# Patient Record
Sex: Male | Born: 1955
Health system: Southern US, Community
[De-identification: ages and names within clinical notes are randomized; demographics above are authoritative.]

## PROBLEM LIST (undated history)

## (undated) DIAGNOSIS — C801 Malignant (primary) neoplasm, unspecified: Secondary | ICD-10-CM

## (undated) DIAGNOSIS — R011 Cardiac murmur, unspecified: Secondary | ICD-10-CM

## (undated) HISTORY — PX: COLONOSCOPY: SHX174

---

## 2001-06-11 ENCOUNTER — Encounter: Payer: Self-pay | Admitting: Internal Medicine

## 2001-06-11 ENCOUNTER — Encounter: Admission: RE | Admit: 2001-06-11 | Discharge: 2001-06-11 | Payer: Self-pay | Admitting: Internal Medicine

## 2006-12-16 ENCOUNTER — Ambulatory Visit (HOSPITAL_COMMUNITY): Admission: RE | Admit: 2006-12-16 | Discharge: 2006-12-16 | Payer: Self-pay | Admitting: Internal Medicine

## 2010-09-26 ENCOUNTER — Encounter: Payer: Self-pay | Admitting: Internal Medicine

## 2016-08-25 DIAGNOSIS — M545 Low back pain: Secondary | ICD-10-CM | POA: Diagnosis not present

## 2016-08-25 DIAGNOSIS — M25551 Pain in right hip: Secondary | ICD-10-CM | POA: Diagnosis not present

## 2017-05-01 DIAGNOSIS — D2272 Melanocytic nevi of left lower limb, including hip: Secondary | ICD-10-CM | POA: Diagnosis not present

## 2017-05-01 DIAGNOSIS — D225 Melanocytic nevi of trunk: Secondary | ICD-10-CM | POA: Diagnosis not present

## 2017-05-01 DIAGNOSIS — D2271 Melanocytic nevi of right lower limb, including hip: Secondary | ICD-10-CM | POA: Diagnosis not present

## 2017-05-01 DIAGNOSIS — Z85828 Personal history of other malignant neoplasm of skin: Secondary | ICD-10-CM | POA: Diagnosis not present

## 2017-05-01 DIAGNOSIS — L570 Actinic keratosis: Secondary | ICD-10-CM | POA: Diagnosis not present

## 2018-05-02 DIAGNOSIS — R05 Cough: Secondary | ICD-10-CM | POA: Diagnosis not present

## 2018-05-21 DIAGNOSIS — Z85828 Personal history of other malignant neoplasm of skin: Secondary | ICD-10-CM | POA: Diagnosis not present

## 2018-05-21 DIAGNOSIS — D225 Melanocytic nevi of trunk: Secondary | ICD-10-CM | POA: Diagnosis not present

## 2018-05-21 DIAGNOSIS — L570 Actinic keratosis: Secondary | ICD-10-CM | POA: Diagnosis not present

## 2018-05-21 DIAGNOSIS — L821 Other seborrheic keratosis: Secondary | ICD-10-CM | POA: Diagnosis not present

## 2018-05-21 DIAGNOSIS — L812 Freckles: Secondary | ICD-10-CM | POA: Diagnosis not present

## 2019-03-01 DIAGNOSIS — Z8249 Family history of ischemic heart disease and other diseases of the circulatory system: Secondary | ICD-10-CM | POA: Diagnosis not present

## 2019-03-01 DIAGNOSIS — Z87891 Personal history of nicotine dependence: Secondary | ICD-10-CM | POA: Diagnosis not present

## 2019-03-01 DIAGNOSIS — R972 Elevated prostate specific antigen [PSA]: Secondary | ICD-10-CM | POA: Diagnosis not present

## 2019-03-01 DIAGNOSIS — Z6832 Body mass index (BMI) 32.0-32.9, adult: Secondary | ICD-10-CM | POA: Diagnosis not present

## 2019-03-01 DIAGNOSIS — E7800 Pure hypercholesterolemia, unspecified: Secondary | ICD-10-CM | POA: Diagnosis not present

## 2019-03-01 DIAGNOSIS — Z125 Encounter for screening for malignant neoplasm of prostate: Secondary | ICD-10-CM | POA: Diagnosis not present

## 2019-03-01 DIAGNOSIS — R739 Hyperglycemia, unspecified: Secondary | ICD-10-CM | POA: Diagnosis not present

## 2019-03-01 DIAGNOSIS — Z23 Encounter for immunization: Secondary | ICD-10-CM | POA: Diagnosis not present

## 2019-03-01 DIAGNOSIS — N529 Male erectile dysfunction, unspecified: Secondary | ICD-10-CM | POA: Diagnosis not present

## 2019-03-01 DIAGNOSIS — Z0000 Encounter for general adult medical examination without abnormal findings: Secondary | ICD-10-CM | POA: Diagnosis not present

## 2019-03-13 ENCOUNTER — Other Ambulatory Visit: Payer: Self-pay | Admitting: Internal Medicine

## 2019-03-13 DIAGNOSIS — E7800 Pure hypercholesterolemia, unspecified: Secondary | ICD-10-CM

## 2019-03-26 ENCOUNTER — Ambulatory Visit
Admission: RE | Admit: 2019-03-26 | Discharge: 2019-03-26 | Disposition: A | Discharge status: Home / Self Care | Payer: No Typology Code available for payment source | Source: Ambulatory Visit | Attending: Internal Medicine | Admitting: Internal Medicine

## 2019-03-26 DIAGNOSIS — E7800 Pure hypercholesterolemia, unspecified: Secondary | ICD-10-CM

## 2019-04-22 DIAGNOSIS — N400 Enlarged prostate without lower urinary tract symptoms: Secondary | ICD-10-CM | POA: Diagnosis not present

## 2019-04-22 DIAGNOSIS — K4090 Unilateral inguinal hernia, without obstruction or gangrene, not specified as recurrent: Secondary | ICD-10-CM | POA: Diagnosis not present

## 2019-04-22 DIAGNOSIS — R972 Elevated prostate specific antigen [PSA]: Secondary | ICD-10-CM | POA: Diagnosis not present

## 2019-06-04 DIAGNOSIS — D225 Melanocytic nevi of trunk: Secondary | ICD-10-CM | POA: Diagnosis not present

## 2019-06-04 DIAGNOSIS — Z85828 Personal history of other malignant neoplasm of skin: Secondary | ICD-10-CM | POA: Diagnosis not present

## 2019-06-04 DIAGNOSIS — D2272 Melanocytic nevi of left lower limb, including hip: Secondary | ICD-10-CM | POA: Diagnosis not present

## 2019-06-04 DIAGNOSIS — D2271 Melanocytic nevi of right lower limb, including hip: Secondary | ICD-10-CM | POA: Diagnosis not present

## 2019-07-15 DIAGNOSIS — R972 Elevated prostate specific antigen [PSA]: Secondary | ICD-10-CM | POA: Diagnosis not present

## 2019-07-15 DIAGNOSIS — C61 Malignant neoplasm of prostate: Secondary | ICD-10-CM | POA: Diagnosis not present

## 2019-08-12 DIAGNOSIS — C61 Malignant neoplasm of prostate: Secondary | ICD-10-CM | POA: Diagnosis not present

## 2019-09-03 DIAGNOSIS — R972 Elevated prostate specific antigen [PSA]: Secondary | ICD-10-CM | POA: Diagnosis not present

## 2019-09-09 DIAGNOSIS — E7800 Pure hypercholesterolemia, unspecified: Secondary | ICD-10-CM | POA: Diagnosis not present

## 2019-09-12 DIAGNOSIS — C61 Malignant neoplasm of prostate: Secondary | ICD-10-CM | POA: Diagnosis not present

## 2019-11-11 DIAGNOSIS — Z23 Encounter for immunization: Secondary | ICD-10-CM | POA: Diagnosis not present

## 2019-12-10 DIAGNOSIS — Z23 Encounter for immunization: Secondary | ICD-10-CM | POA: Diagnosis not present

## 2019-12-18 DIAGNOSIS — C61 Malignant neoplasm of prostate: Secondary | ICD-10-CM | POA: Diagnosis not present

## 2019-12-25 DIAGNOSIS — C61 Malignant neoplasm of prostate: Secondary | ICD-10-CM | POA: Diagnosis not present

## 2020-04-07 DIAGNOSIS — E7800 Pure hypercholesterolemia, unspecified: Secondary | ICD-10-CM | POA: Diagnosis not present

## 2020-04-07 DIAGNOSIS — Z1159 Encounter for screening for other viral diseases: Secondary | ICD-10-CM | POA: Diagnosis not present

## 2020-04-07 DIAGNOSIS — Z0000 Encounter for general adult medical examination without abnormal findings: Secondary | ICD-10-CM | POA: Diagnosis not present

## 2020-04-07 DIAGNOSIS — R7301 Impaired fasting glucose: Secondary | ICD-10-CM | POA: Diagnosis not present

## 2020-04-20 DIAGNOSIS — C61 Malignant neoplasm of prostate: Secondary | ICD-10-CM | POA: Diagnosis not present

## 2020-04-28 ENCOUNTER — Other Ambulatory Visit: Payer: Self-pay | Admitting: Urology

## 2020-04-28 DIAGNOSIS — C61 Malignant neoplasm of prostate: Secondary | ICD-10-CM

## 2020-05-25 ENCOUNTER — Other Ambulatory Visit: Payer: Self-pay

## 2020-05-25 ENCOUNTER — Ambulatory Visit
Admission: RE | Admit: 2020-05-25 | Discharge: 2020-05-25 | Disposition: A | Discharge status: Home / Self Care | Payer: BC Managed Care – PPO | Source: Ambulatory Visit | Attending: Urology | Admitting: Urology

## 2020-05-25 DIAGNOSIS — C61 Malignant neoplasm of prostate: Secondary | ICD-10-CM | POA: Diagnosis not present

## 2020-05-25 IMAGING — MR MR PROSTATE WO/W CM
12 series · 48 of 48 positions shown · IV contrast (multihance)
Comparison: None.

CLINICAL DATA: Prostate carcinoma, Gleason score 3+4=7.

EXAM:
MR PROSTATE WITHOUT AND WITH CONTRAST
TECHNIQUE: Multiplanar multisequence MRI images were obtained of the pelvis
centered about the prostate. Pre and post contrast images were
obtained.
CONTRAST:  19mL MULTIHANCE GADOBENATE DIMEGLUMINE 529 MG/ML IV SOLN

[Series 4: T2 · coronal · 3.0mm · 0.56mm/px · 1 of 26 slices shown (1 of 3)]
[im 1/26]
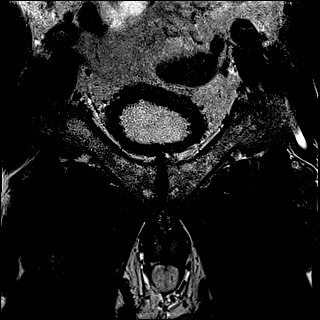

[Series 5: T1 · axial · 5.0mm · 1.25mm/px · z∈[-119,+116]mm · 2 of 96 slices shown]
[im 1/96]
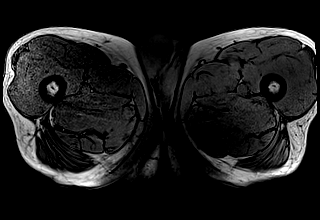
[im 96/96]
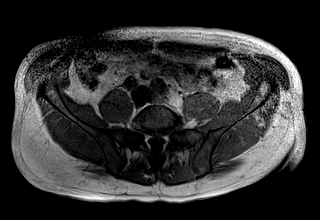

[Series 6: DWI · axial · 3.0mm · 1.75mm/px · 1 of 75 slices shown (1 of 3)]
[im 1/75]
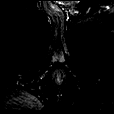

[Series 7: DWI · axial · 3.0mm · 1.75mm/px · 1 of 25 slices shown (2 of 3)]
[im 1/25]
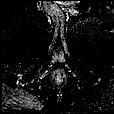

[Series 8: DWI · axial · 3.0mm · 1.75mm/px · 1 of 25 slices shown (3 of 3)]
[im 1/25]
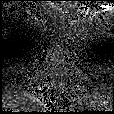

[Series 9: T2 · axial · 3.0mm · 0.56mm/px · 1 of 26 slices shown (2 of 3)]
[im 1/26]
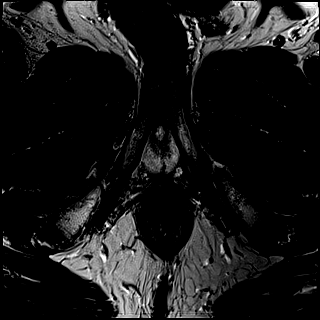

[Series 10: T2 · axial · 1.0mm · 1.04mm/px · z∈[-67,+12]mm · 2 of 80 slices shown (3 of 3)]
[im 1/80]
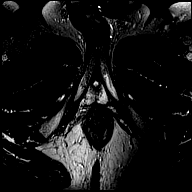
[im 80/80]
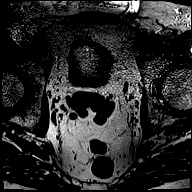

[Series 11: pre t1_twist_tra_dyn · axial · non-contrast · 3.5mm · 0.83mm/px · 1 of 22 slices shown]
[im 1/22]
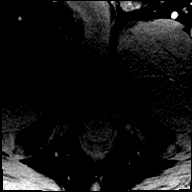

[Series 12: post t1_twist_tra_dyn-copy center · axial · non-contrast · 3.5mm · 0.83mm/px · z∈[-65,+9]mm · 17 of 660 slices shown]
[im 1/660]
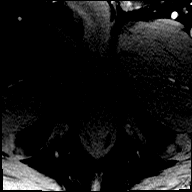
[im 42/660]
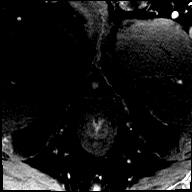
[im 83/660]
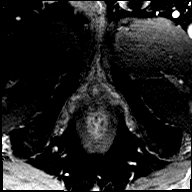
[im 124/660]
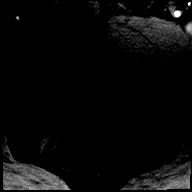
[im 165/660]
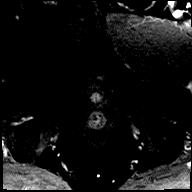
[im 206/660]
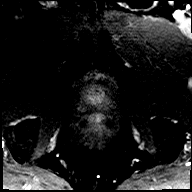
[im 248/660]
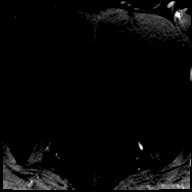
[im 289/660]
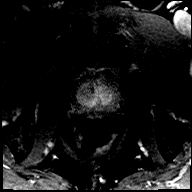
[im 330/660]
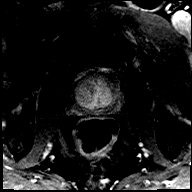
[im 371/660]
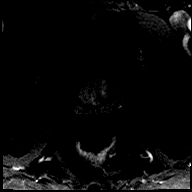
[im 412/660]
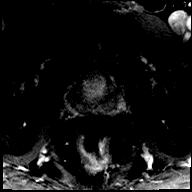
[im 454/660]
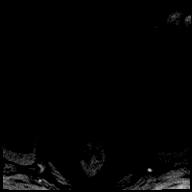
[im 495/660]
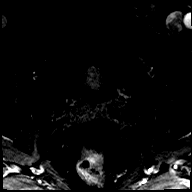
[im 536/660]
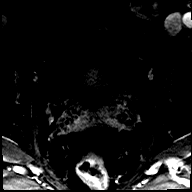
[im 577/660]
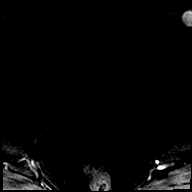
[im 618/660]
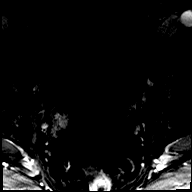
[im 660/660]
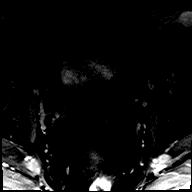

[Series 13: post t1_twist_tra_dyn-copy cent_sub · axial · 3.5mm · 0.83mm/px · z∈[-65,+9]mm · 17 of 637 slices shown]
[im 1/637]
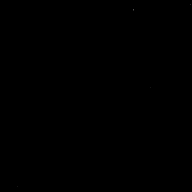
[im 40/637]
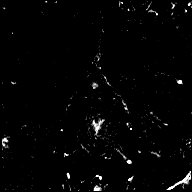
[im 80/637]
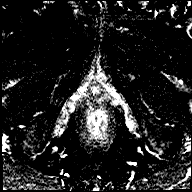
[im 120/637]
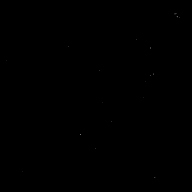
[im 160/637]
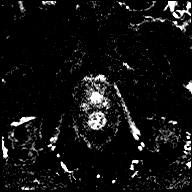
[im 199/637]
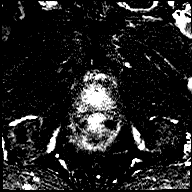
[im 239/637]
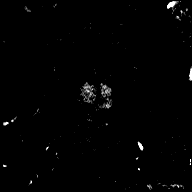
[im 279/637]
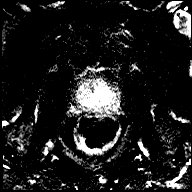
[im 319/637]
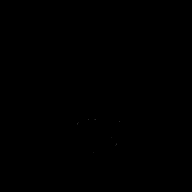
[im 358/637]
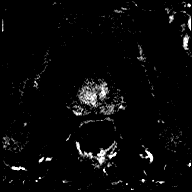
[im 398/637]
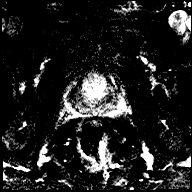
[im 438/637]
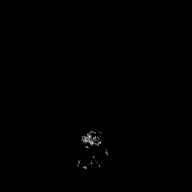
[im 478/637]
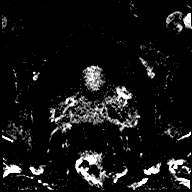
[im 517/637]
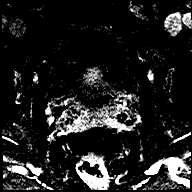
[im 557/637]
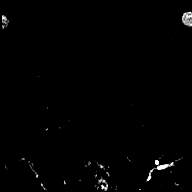
[im 597/637]
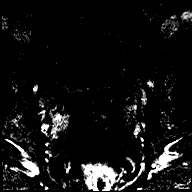
[im 637/637]
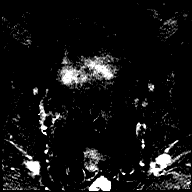

[Series 14: t1_vibe_dixon_tra_f · axial · 2.5mm · 0.91mm/px · z∈[-101,+97]mm · 2 of 80 slices shown]
[im 1/80]
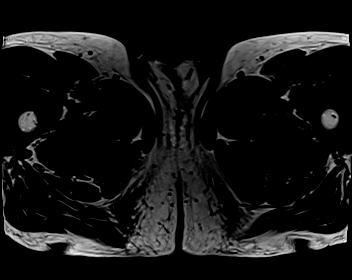
[im 80/80]
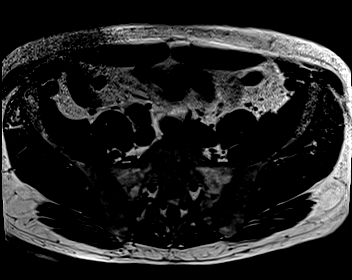

[Series 15: t1_vibe_dixon_tra_w · axial · 2.5mm · 0.91mm/px · z∈[-101,+97]mm · 2 of 80 slices shown]
[im 1/80]
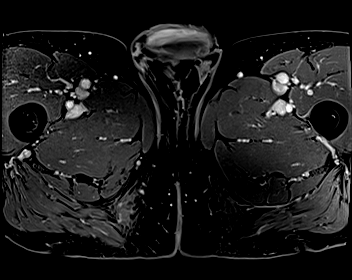
[im 80/80]
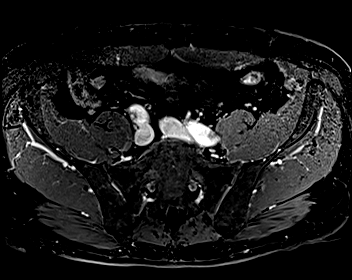

[48 of 48 positions shown; findings below may reference images not displayed]

FINDINGS: Prostate:

-- Peripheral Zone: Ill-defined area of T2 hypointensity is seen in
the left lateral apex which measures 1.2 x 0.5 cm on image 53/10.
This shows moderate ADC hypointensity, no DWI hyperintensity, and
early focal contrast enhancement. No other suspicious peripheral
zone nodules identified.

-- Transition/Central Zone: Hypertrophy with several small
circumscribed BPH nodules are noted, but no suspicious nodules with
obscured or non-circumscribed margins seen.

-- Measurements/Volume:  4.1 x 4.4 x 4.9 cm (volume = 46 cm^3)

Transcapsular spread:  Absent

Seminal vesicle involvement:  Absent

Neurovascular bundle involvement:  Absent

Pelvic adenopathy: None visualized

Bone metastasis: None visualized

Other:  Sigmoid diverticulosis, without evidence of diverticulitis.
IMPRESSION: 1.2 cm peripheral zone nodule in the left lateral apex, suspicious
for high-grade carcinoma. PI-RADS 4: High (clinically significant
cancer is likely to be present).

No evidence of extracapsular extension or pelvic metastatic disease.

(I have processed this exam in the DynaCAD application for MR/US
fusion-guided biopsy if performed.)

## 2020-05-25 MED ORDER — GADOBENATE DIMEGLUMINE 529 MG/ML IV SOLN
19.0000 mL | Freq: Once | INTRAVENOUS | Status: AC | PRN
  Administered 2020-05-25: 19 mL via INTRAVENOUS

## 2020-06-10 DIAGNOSIS — Z85828 Personal history of other malignant neoplasm of skin: Secondary | ICD-10-CM | POA: Diagnosis not present

## 2020-06-10 DIAGNOSIS — D2271 Melanocytic nevi of right lower limb, including hip: Secondary | ICD-10-CM | POA: Diagnosis not present

## 2020-06-10 DIAGNOSIS — D225 Melanocytic nevi of trunk: Secondary | ICD-10-CM | POA: Diagnosis not present

## 2020-06-10 DIAGNOSIS — L821 Other seborrheic keratosis: Secondary | ICD-10-CM | POA: Diagnosis not present

## 2020-06-29 DIAGNOSIS — C61 Malignant neoplasm of prostate: Secondary | ICD-10-CM | POA: Diagnosis not present

## 2020-08-04 DIAGNOSIS — Z86010 Personal history of colonic polyps: Secondary | ICD-10-CM | POA: Diagnosis not present

## 2020-08-07 DIAGNOSIS — Z1159 Encounter for screening for other viral diseases: Secondary | ICD-10-CM | POA: Diagnosis not present

## 2020-08-10 DIAGNOSIS — C61 Malignant neoplasm of prostate: Secondary | ICD-10-CM | POA: Diagnosis not present

## 2020-08-12 DIAGNOSIS — K5730 Diverticulosis of large intestine without perforation or abscess without bleeding: Secondary | ICD-10-CM | POA: Diagnosis not present

## 2020-08-12 DIAGNOSIS — K644 Residual hemorrhoidal skin tags: Secondary | ICD-10-CM | POA: Diagnosis not present

## 2020-08-12 DIAGNOSIS — D123 Benign neoplasm of transverse colon: Secondary | ICD-10-CM | POA: Diagnosis not present

## 2020-08-12 DIAGNOSIS — Z86010 Personal history of colonic polyps: Secondary | ICD-10-CM | POA: Diagnosis not present

## 2020-08-12 DIAGNOSIS — K648 Other hemorrhoids: Secondary | ICD-10-CM | POA: Diagnosis not present

## 2020-08-12 DIAGNOSIS — D125 Benign neoplasm of sigmoid colon: Secondary | ICD-10-CM | POA: Diagnosis not present

## 2020-08-25 DIAGNOSIS — C61 Malignant neoplasm of prostate: Secondary | ICD-10-CM | POA: Diagnosis not present

## 2020-09-07 ENCOUNTER — Other Ambulatory Visit: Payer: Self-pay | Admitting: Urology

## 2020-09-09 DIAGNOSIS — M62838 Other muscle spasm: Secondary | ICD-10-CM | POA: Diagnosis not present

## 2020-09-09 DIAGNOSIS — M6289 Other specified disorders of muscle: Secondary | ICD-10-CM | POA: Diagnosis not present

## 2020-09-09 DIAGNOSIS — M6281 Muscle weakness (generalized): Secondary | ICD-10-CM | POA: Diagnosis not present

## 2020-09-24 NOTE — Progress Notes (Signed)
DUE TO COVID-19 ONLY ONE VISITOR IS ALLOWED TO COME WITH YOU AND STAY IN THE WAITING ROOM ONLY DURING PRE OP AND PROCEDURE DAY OF SURGERY. THE 1 VISITOR  MAY VISIT WITH YOU AFTER SURGERY IN YOUR PRIVATE ROOM DURING VISITING HOURS ONLY!  YOU NEED TO HAVE A COVID 19 TEST ON_1/31/2022 ______ @_______ , THIS TEST MUST BE DONE BEFORE SURGERY,  COVID TESTING SITE [address] [place] [address], IT IS ON THE RIGHT GOING OUT [address] APPROXIMATELY  2 MINUTES PAST [employer] ON THE RIGHT. ONCE YOUR COVID TEST IS COMPLETED,  PLEASE BEGIN THE QUARANTINE INSTRUCTIONS AS OUTLINED IN YOUR HANDOUT.                Justin Reed  09/24/2020   Your procedure is scheduled on: 10/08/2020    Report to [facility] Main  Entrance   Report to admitting at     [place] AM     Call this number if you have problems the morning of surgery [phone]    Remember: Do not eat food , candy gum or mints :After Midnight. You may have clear liquids from midnight until 0430am   Magnesium citrate- 8 ounces at 12 noon day before surgery.   Fleets enema nite before surgery  CLEAR LIQUID DIET   Foods Allowed                                                                       Coffee and tea, regular and decaf                              Plain Jell-O any favor except red or purple                                            Fruit ices (not with fruit pulp)                                      Iced Popsicles                                     Carbonated beverages, regular and diet                                    Cranberry, grape and apple juices Sports drinks like Gatorade Lightly seasoned clear broth or consume(fat free) Sugar, honey syrup   _____________________________________________________________________    BRUSH YOUR TEETH MORNING OF SURGERY AND RINSE YOUR MOUTH OUT, NO CHEWING GUM CANDY OR MINTS.     Take these medicines the morning of surgery with A SIP OF WATER: none    DO NOT TAKE ANY DIABETIC MEDICATIONS DAY OF YOUR SURGERY  You may not have any metal on your body including hair pins and              piercings  Do not wear jewelry, make-up, lotions, powders or perfumes, deodorant             Do not wear nail polish on your fingernails.  Do not shave  48 hours prior to surgery.              Men may shave face and neck.   Do not bring valuables to the hospital. [place].  Contacts, dentures or bridgework may not be worn into surgery.  Leave suitcase in the car. After surgery it may be brought to your room.     Patients discharged the day of surgery will not be allowed to drive home. IF YOU ARE HAVING SURGERY AND GOING HOME THE SAME DAY, YOU MUST HAVE AN ADULT TO DRIVE YOU HOME AND BE WITH YOU FOR 24 HOURS. YOU MAY GO HOME BY TAXI OR UBER OR ORTHERWISE, BUT AN ADULT MUST ACCOMPANY YOU HOME AND STAY WITH YOU FOR 24 HOURS.  Name and phone number of your driver:  Special Instructions: N/A              Please read over the following fact sheets you were given: _____________________________________________________________________  [facility] - Preparing for Surgery Before surgery, you can play an important role.  Because skin is not sterile, your skin needs to be as free of germs as possible.  You can reduce the number of germs on your skin by washing with CHG (chlorahexidine gluconate) soap before surgery.  CHG is an antiseptic cleaner which kills germs and bonds with the skin to continue killing germs even after washing. Please DO NOT use if you have an allergy to CHG or antibacterial soaps.  If your skin becomes reddened/irritated stop using the CHG and inform your nurse when you arrive at Short Stay. Do not shave (including legs and underarms) for at least 48 hours prior to the first CHG shower.  You may shave your face/neck. Please follow these instructions carefully:  1.   Shower with CHG Soap the night before surgery and the  morning of Surgery.  2.  If you choose to wash your hair, wash your hair first as usual with your  normal  shampoo.  3.  After you shampoo, rinse your hair and body thoroughly to remove the  shampoo.                           4.  Use CHG as you would any other liquid soap.  You can apply chg directly  to the skin and wash                       Gently with a scrungie or clean washcloth.  5.  Apply the CHG Soap to your body ONLY FROM THE NECK DOWN.   Do not use on face/ open                           Wound or open sores. Avoid contact with eyes, ears mouth and genitals (private parts).  Wash face,  Genitals (private parts) with your normal soap.             6.  Wash thoroughly, paying special attention to the area where your surgery  will be performed.  7.  Thoroughly rinse your body with warm water from the neck down.  8.  DO NOT shower/wash with your normal soap after using and rinsing off  the CHG Soap.                9.  Pat yourself dry with a clean towel.            10.  Wear clean pajamas.            11.  Place clean sheets on your bed the night of your first shower and do not  sleep with pets. Day of Surgery : Do not apply any lotions/deodorants the morning of surgery.  Please wear clean clothes to the hospital/surgery center.  FAILURE TO FOLLOW THESE INSTRUCTIONS MAY RESULT IN THE CANCELLATION OF YOUR SURGERY PATIENT SIGNATURE_________________________________  NURSE SIGNATURE__________________________________  ________________________________________________________________________

## 2020-09-28 ENCOUNTER — Encounter (HOSPITAL_COMMUNITY): Payer: Self-pay

## 2020-09-28 ENCOUNTER — Other Ambulatory Visit: Payer: Self-pay

## 2020-09-28 ENCOUNTER — Encounter (INDEPENDENT_AMBULATORY_CARE_PROVIDER_SITE_OTHER): Payer: Self-pay

## 2020-09-28 ENCOUNTER — Encounter (HOSPITAL_COMMUNITY)
Admission: RE | Admit: 2020-09-28 | Discharge: 2020-09-28 | Disposition: A | Discharge status: Home / Self Care | Payer: BC Managed Care – PPO | Source: Ambulatory Visit | Attending: Urology | Admitting: Urology

## 2020-09-28 DIAGNOSIS — R519 Headache, unspecified: Secondary | ICD-10-CM | POA: Diagnosis not present

## 2020-09-28 DIAGNOSIS — Z01812 Encounter for preprocedural laboratory examination: Secondary | ICD-10-CM | POA: Diagnosis not present

## 2020-09-28 HISTORY — DX: Malignant (primary) neoplasm, unspecified: C80.1

## 2020-09-28 HISTORY — DX: Cardiac murmur, unspecified: R01.1

## 2020-09-28 LAB — CBC
HCT: 49.2 % (ref 39.0–52.0)
Hemoglobin: 17 g/dL (ref 13.0–17.0)
MCH: 33.1 pg (ref 26.0–34.0)
MCHC: 34.6 g/dL (ref 30.0–36.0)
MCV: 95.7 fL (ref 80.0–100.0)
Platelets: 148 10*3/uL — ABNORMAL LOW (ref 150–400)
RBC: 5.14 MIL/uL (ref 4.22–5.81)
RDW: 12.6 % (ref 11.5–15.5)
WBC: 6.4 10*3/uL (ref 4.0–10.5)
nRBC: 0 % (ref 0.0–0.2)

## 2020-09-28 LAB — BASIC METABOLIC PANEL
Anion gap: 10 (ref 5–15)
BUN: 16 mg/dL (ref 8–23)
CO2: 26 mmol/L (ref 22–32)
Calcium: 9.8 mg/dL (ref 8.9–10.3)
Chloride: 102 mmol/L (ref 98–111)
Creatinine, Ser: 1.01 mg/dL (ref 0.61–1.24)
GFR, Estimated: >60 mL/min (ref >60)
Glucose, Bld: 109 mg/dL — ABNORMAL HIGH (ref 70–99)
Potassium: 4.8 mmol/L (ref 3.5–5.1)
Sodium: 138 mmol/L (ref 135–145)

## 2020-09-28 NOTE — Progress Notes (Addendum)
Anesthesia Review:  PCP: DR Lavone Orn  Cardiologist : none per pt  03/2019- Cardiac scoring  Chest x-ray : EKG : Echo : Stress test: Cardiac Cath :  Activity level:  Can do a flight of stairs without difficulty  Sleep Study/ CPAP : Fasting Blood Sugar :      / Checks Blood Sugar -- times a day:   Blood Thinner/ Instructions /Last Dose: ASA / Instructions/ Last Dose :  Temp 100.7  Pt has cough which is not new for him.  Audria Nine made aware.  Informed pt to go get covid tested.  Called and spoke with Coni Mabe at office and made her aware.  Coni will inform Dr Alinda Money.  PCp- DR Lavone Orn

## 2020-09-29 DIAGNOSIS — M6281 Muscle weakness (generalized): Secondary | ICD-10-CM | POA: Diagnosis not present

## 2020-09-29 DIAGNOSIS — M62838 Other muscle spasm: Secondary | ICD-10-CM | POA: Diagnosis not present

## 2020-09-29 DIAGNOSIS — N393 Stress incontinence (female) (male): Secondary | ICD-10-CM | POA: Diagnosis not present

## 2020-09-30 DIAGNOSIS — U071 COVID-19: Secondary | ICD-10-CM | POA: Diagnosis not present

## 2020-10-05 ENCOUNTER — Other Ambulatory Visit (HOSPITAL_COMMUNITY): Payer: BC Managed Care – PPO

## 2020-10-06 DIAGNOSIS — U071 COVID-19: Secondary | ICD-10-CM | POA: Diagnosis not present

## 2020-10-06 DIAGNOSIS — Z20822 Contact with and (suspected) exposure to covid-19: Secondary | ICD-10-CM | POA: Diagnosis not present

## 2020-10-06 NOTE — Progress Notes (Signed)
Called and LVMM for pt to call us back in regards to surgery on 10/19/2020 to just review preop instructions.

## 2020-10-06 NOTE — Progress Notes (Addendum)
Spoke with pt via phone.  Pt reports testing positive for coivd on 09/28/2020 at [facility] in Clinic on [address].  Reviwed preop instructions with pt .  No new med hx to report.  Pt aware to arrive at 0515 am on 10/19/2020.  Pt still has original preop instructgions and will follow those instructions.  Pt voiced understanding.  Pt had another covid test on 10/06/20 per pt to make sure he is negative.  Pt to report any abnormalities to surgeon office.  Requested ccovid results from [facility] In  [phone].

## 2020-10-08 LAB — TYPE AND SCREEN
ABO/RH(D): O POS
Antibody Screen: NEGATIVE

## 2020-10-15 NOTE — Progress Notes (Signed)
Left a voicemail on the [facility] nurse line [phone] at [place] to request positive test results from 09/28/20, be faxed to [place].

## 2020-10-16 NOTE — H&P (Signed)
CC: Prostate Cancer    Justin Reed is a 65 year old gentleman who was initially diagnosed with very low risk prostate cancer in November 2020. He elected active surveillance management. His PSA was 6.07 in September 2021 and a prostate MRI indicated a 12 mm PIRADS 4 lesion at the left lateral apex. An MR/US fusion biopsy of the prostate was performed on 06/29/20. All targeted cores were negative for malignancy but 5 out of the 12 systematic cores were positive for Gleason 3+4=7 adenocarcinoma.   Family history: None.   Imaging studies:  MRI (05/25/20): No EPE, SVI, LAD, or bone lesions.   PMH: He has no major medical comorbidities.  PSH: No abdominal surgeries.   TNM stage: cT1c Nx Mx  PSA: 6.07  Gleason score: 3+4=7 (GG 2)  Biopsy (06/29/20): 5/16 cores positive  Left: L lateral apex (10%, 3+4=7), L lateral mid (20%, 3+4=7), L mid (20%, 3+4=7)  Right: R apex (5%, 3+4=7), R mid (10%, 3+4=7)  Prostate volume: 44.5 cc   Nomogram  OC disease: 68%  EPE: 31%  SVI: 2%  LNI: 2%  PFS (5 year, 10 year): 87%, 79%   Urinary function: IPSS is 10.  Erectile function: SHIM score is 23. He will occasionally use generic sildenafil but less than half the time.     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 Initially followed w/ AS, now w/ upstaging on repeat bx. Pt has moderate LUTS and good sexual function. Kattan predictions-- OCD--67% SV/LN involvement--2% each 5/10 yr progression free survival w/ RP--87/79% respectively - 08/10/2020, - 06/29/2020, PSA slightly decreased from last checked. We are continuing with active surveillance w/ regular PSA checks and prostate MRI and fusion  TRUS/Bx. DRE today normal. , - 12/25/2019, New diagnosis of adenocarcinoma the prostate. 1/12 cores positive for GS 3+4 pattern, 20% of that core was involved. There was 1 contralateral core on the right prostate that showed atypia. He has minimal voiding symptoms, excellent erectile dysfunction. Justin Reed is nomogram predictions-- Organ confined disease--80% Lymph nodes/seminal vesicle involvement--1% each 5/10 year progression free probability after radical prostatectomy--92/86%, respectively., - 08/12/2019 Elevated PSA - 07/15/2019, Will recheck PSA and follow-up with results -- will determine appropriate follow-up if necessary. , - 04/22/2019 BPH w/o LUTS - 04/22/2019 Unil Inguinal Hernia W/O obst or gang,non-recurrent, Left, Asymptomatic hernia on left side -- patient informed of this and assured this was benign with no need for treatment unless pain or discomfort development. - 04/22/2019    NON-GU PMH: Hypercholesterolemia    FAMILY HISTORY: Aortic Aneurysm - Mother copd - Father Death of family member - Father, Mother Heart Disease - Mother, Father Obesity - Father, Mother   SOCIAL HISTORY: Marital Status: Married Preferred Language: English; Ethnicity: Not Hispanic Or Latino; Race: White Current Smoking Status: Patient does not smoke anymore. Has not smoked since 04/05/1998.   Tobacco Use Assessment Completed: Used Tobacco in last 30 days? Has never drank.  Drinks 2 caffeinated drinks per day. Patient's occupation Technical brewer.    REVIEW OF SYSTEMS:    GU Review Male:   Patient denies frequent urination, hard to postpone urination, burning/ pain with urination, get up at  night to urinate, leakage of urine, stream starts and stops, trouble starting your streams, and have to strain to urinate .  Gastrointestinal (Upper):   Patient denies nausea and vomiting.  Gastrointestinal (Lower):   Patient denies diarrhea and constipation.  Constitutional:   Patient denies fever, night  sweats, weight loss, and fatigue.  Skin:   Patient denies skin rash/ lesion and itching.  Eyes:   Patient denies blurred vision and double vision.  Ears/ Nose/ Throat:   Patient denies sore throat and sinus problems.  Hematologic/Lymphatic:   Patient denies swollen glands and easy bruising.  Cardiovascular:   Patient denies leg swelling and chest pains.  Respiratory:   Patient denies cough and shortness of breath.  Endocrine:   Patient denies excessive thirst.  Musculoskeletal:   Patient denies joint pain and back pain.  Neurological:   Patient denies headaches and dizziness.  Psychologic:   Patient denies depression and anxiety.   VITAL SIGNS:     Weight 205 lb / 92.99 kg  Height 70 in / 177.8 cm  BMI 29.4 kg/m     MULTI-SYSTEM PHYSICAL EXAMINATION:    Constitutional: Well-nourished. No physical deformities. Normally developed. Good grooming.  Neck: Neck symmetrical, not swollen. Normal tracheal position.  Respiratory: No labored breathing, no use of accessory muscles. Clear bilaterally.  Cardiovascular: Normal temperature, normal extremity pulses, no swelling, no varicosities. Regular rate and rhythm.  Lymphatic: No enlargement of neck, axillae, groin.  Skin: No paleness, no jaundice, no cyanosis. No lesion, no ulcer, no rash.  Neurologic / Psychiatric: Oriented to time, oriented to place, oriented to person. No depression, no anxiety, no agitation.  Gastrointestinal: No mass, no tenderness, no rigidity, non obese abdomen.  Eyes: Normal conjunctivae. Normal eyelids.  Ears, Nose, Mouth, and Throat: Left ear no scars, no lesions, no masses. Right ear no scars, no lesions, no masses. Nose no scars, no lesions, no masses. Normal hearing. Normal lips.  Musculoskeletal: Normal gait and station of head and neck.       ASSESSMENT:      ICD-10 Details  1 GU:   Prostate Cancer - C61    PLAN:       1. Prostate cancer:  He wishes to proceed with surgical treatment. He will be  scheduled for a bilateral nerve-sparing robot assisted laparoscopic radical prostatectomy and bilateral pelvic lymphadenectomy.

## 2020-10-17 NOTE — Anesthesia Preprocedure Evaluation (Addendum)
Anesthesia Evaluation  Patient identified by MRN, date of birth, ID band Patient awake    Reviewed: Allergy & Precautions, NPO status , Patient's Chart, lab work & pertinent test results  History of Anesthesia Complications Negative for: history of anesthetic complications  Airway Mallampati: II  TM Distance: >3 FB Neck ROM: Full    Dental no notable dental hx. (+) Dental Advisory Given   Pulmonary neg pulmonary ROS, former smoker,  Recent covid infection   Pulmonary exam normal        Cardiovascular negative cardio ROS Normal cardiovascular exam     Neuro/Psych negative neurological ROS     GI/Hepatic negative GI ROS, Neg liver ROS,   Endo/Other  negative endocrine ROS  Renal/GU negative Renal ROS   Prostate ca    Musculoskeletal negative musculoskeletal ROS (+)   Abdominal   Peds  Hematology negative hematology ROS (+)   Anesthesia Other Findings   Reproductive/Obstetrics                            Anesthesia Physical Anesthesia Plan  ASA: II  Anesthesia Plan: General   Post-op Pain Management:    Induction: Intravenous  PONV Risk Score and Plan: 3 and Ondansetron, Dexamethasone and Midazolam  Airway Management Planned: Oral ETT  Additional Equipment:   Intra-op Plan:   Post-operative Plan: Extubation in OR  Informed Consent: I have reviewed the patients History and Physical, chart, labs and discussed the procedure including the risks, benefits and alternatives for the proposed anesthesia with the patient or authorized representative who has indicated his/her understanding and acceptance.     Dental advisory given  Plan Discussed with: Anesthesiologist  Anesthesia Plan Comments:        Anesthesia Quick Evaluation

## 2020-10-19 ENCOUNTER — Other Ambulatory Visit: Payer: Self-pay

## 2020-10-19 ENCOUNTER — Observation Stay (HOSPITAL_COMMUNITY)
Admission: RE | Admit: 2020-10-19 | Discharge: 2020-10-20 | Disposition: A | Discharge status: Home / Self Care | Payer: BC Managed Care – PPO | Source: Ambulatory Visit | Attending: Urology | Admitting: Urology

## 2020-10-19 ENCOUNTER — Encounter (HOSPITAL_COMMUNITY): Payer: Self-pay | Admitting: Urology

## 2020-10-19 ENCOUNTER — Encounter (HOSPITAL_COMMUNITY)
Admission: RE | Disposition: A | Discharge status: Home / Self Care | Payer: Self-pay | Source: Ambulatory Visit | Attending: Urology

## 2020-10-19 ENCOUNTER — Ambulatory Visit (HOSPITAL_COMMUNITY): Payer: BC Managed Care – PPO | Admitting: Physician Assistant

## 2020-10-19 ENCOUNTER — Ambulatory Visit (HOSPITAL_COMMUNITY): Payer: BC Managed Care – PPO | Admitting: Anesthesiology

## 2020-10-19 DIAGNOSIS — Z87891 Personal history of nicotine dependence: Secondary | ICD-10-CM | POA: Diagnosis not present

## 2020-10-19 DIAGNOSIS — Z7982 Long term (current) use of aspirin: Secondary | ICD-10-CM | POA: Insufficient documentation

## 2020-10-19 DIAGNOSIS — C61 Malignant neoplasm of prostate: Secondary | ICD-10-CM | POA: Diagnosis not present

## 2020-10-19 HISTORY — PX: LYMPHADENECTOMY: SHX5960

## 2020-10-19 HISTORY — PX: ROBOT ASSISTED LAPAROSCOPIC RADICAL PROSTATECTOMY: SHX5141

## 2020-10-19 LAB — CBC
HCT: 47.3 % (ref 39.0–52.0)
Hemoglobin: 16.2 g/dL (ref 13.0–17.0)
MCH: 32.7 pg (ref 26.0–34.0)
MCHC: 34.2 g/dL (ref 30.0–36.0)
MCV: 95.4 fL (ref 80.0–100.0)
Platelets: 160 10*3/uL (ref 150–400)
RBC: 4.96 MIL/uL (ref 4.22–5.81)
RDW: 12.6 % (ref 11.5–15.5)
WBC: 4.6 10*3/uL (ref 4.0–10.5)
nRBC: 0 % (ref 0.0–0.2)

## 2020-10-19 LAB — BASIC METABOLIC PANEL
Anion gap: 11 (ref 5–15)
BUN: 14 mg/dL (ref 8–23)
CO2: 25 mmol/L (ref 22–32)
Calcium: 9.3 mg/dL (ref 8.9–10.3)
Chloride: 103 mmol/L (ref 98–111)
Creatinine, Ser: 0.91 mg/dL (ref 0.61–1.24)
GFR, Estimated: >60 mL/min (ref >60)
Glucose, Bld: 91 mg/dL (ref 70–99)
Potassium: 3.4 mmol/L — ABNORMAL LOW (ref 3.5–5.1)
Sodium: 139 mmol/L (ref 135–145)

## 2020-10-19 LAB — TYPE AND SCREEN
ABO/RH(D): O POS
Antibody Screen: NEGATIVE

## 2020-10-19 LAB — HEMOGLOBIN AND HEMATOCRIT, BLOOD
HCT: 41.4 % (ref 39.0–52.0)
Hemoglobin: 14.3 g/dL (ref 13.0–17.0)

## 2020-10-19 SURGERY — XI ROBOTIC ASSISTED LAPAROSCOPIC RADICAL PROSTATECTOMY LEVEL 2
Anesthesia: General

## 2020-10-19 MED ORDER — DEXAMETHASONE SODIUM PHOSPHATE 10 MG/ML IJ SOLN
INTRAMUSCULAR | Status: DC | PRN
  Administered 2020-10-19: 4 mg via INTRAVENOUS

## 2020-10-19 MED ORDER — LACTATED RINGERS IV SOLN: INTRAVENOUS | Status: DC | PRN

## 2020-10-19 MED ORDER — FENTANYL CITRATE (PF) 100 MCG/2ML IJ SOLN
INTRAMUSCULAR | Status: AC
  Filled 2020-10-19: qty 2

## 2020-10-19 MED ORDER — TRAMADOL HCL 50 MG PO TABS: 50.0000 mg | ORAL_TABLET | Freq: Four times a day (QID) | ORAL | 0 refills | Status: DC | PRN

## 2020-10-19 MED ORDER — HYDROMORPHONE HCL 1 MG/ML IJ SOLN
0.5000 mg | INTRAMUSCULAR | Status: DC | PRN
  Administered 2020-10-19 (×2): 0.5 mg via INTRAVENOUS

## 2020-10-19 MED ORDER — BACITRACIN-NEOMYCIN-POLYMYXIN 400-5-5000 EX OINT: 1.0000 "application " | TOPICAL_OINTMENT | Freq: Three times a day (TID) | CUTANEOUS | Status: DC | PRN

## 2020-10-19 MED ORDER — PROPOFOL 10 MG/ML IV BOLUS
INTRAVENOUS | Status: AC
  Filled 2020-10-19: qty 20

## 2020-10-19 MED ORDER — HYDROMORPHONE HCL 1 MG/ML IJ SOLN
INTRAMUSCULAR | Status: AC
  Administered 2020-10-19: 0.5 mg via INTRAVENOUS
  Filled 2020-10-19: qty 1

## 2020-10-19 MED ORDER — HEPARIN SODIUM (PORCINE) 1000 UNIT/ML IJ SOLN
INTRAMUSCULAR | Status: AC
  Filled 2020-10-19: qty 1

## 2020-10-19 MED ORDER — DIPHENHYDRAMINE HCL 12.5 MG/5ML PO ELIX: 12.5000 mg | ORAL_SOLUTION | Freq: Four times a day (QID) | ORAL | Status: DC | PRN

## 2020-10-19 MED ORDER — KCL IN DEXTROSE-NACL 20-5-0.45 MEQ/L-%-% IV SOLN
INTRAVENOUS | Status: DC
  Filled 2020-10-19 (×3): qty 1000

## 2020-10-19 MED ORDER — BUPIVACAINE-EPINEPHRINE 0.25% -1:200000 IJ SOLN
INTRAMUSCULAR | Status: DC | PRN
  Administered 2020-10-19: 30 mL

## 2020-10-19 MED ORDER — CHLORHEXIDINE GLUCONATE CLOTH 2 % EX PADS
6.0000 | MEDICATED_PAD | Freq: Every day | CUTANEOUS | Status: DC
  Administered 2020-10-19 – 2020-10-20 (×2): 6 via TOPICAL

## 2020-10-19 MED ORDER — DOCUSATE SODIUM 100 MG PO CAPS
100.0000 mg | ORAL_CAPSULE | Freq: Two times a day (BID) | ORAL | Status: DC
  Administered 2020-10-19 – 2020-10-20 (×2): 100 mg via ORAL
  Filled 2020-10-19 (×2): qty 1

## 2020-10-19 MED ORDER — CELECOXIB 200 MG PO CAPS
200.0000 mg | ORAL_CAPSULE | Freq: Once | ORAL | Status: AC
  Administered 2020-10-19: 200 mg via ORAL
  Filled 2020-10-19: qty 1

## 2020-10-19 MED ORDER — SODIUM CHLORIDE 0.9 % IR SOLN
Status: DC | PRN
  Administered 2020-10-19: 1000 mL via INTRAVESICAL

## 2020-10-19 MED ORDER — ROCURONIUM BROMIDE 10 MG/ML (PF) SYRINGE
PREFILLED_SYRINGE | INTRAVENOUS | Status: DC | PRN
  Administered 2020-10-19 (×3): 20 mg via INTRAVENOUS
  Administered 2020-10-19: 60 mg via INTRAVENOUS
  Administered 2020-10-19: 20 mg via INTRAVENOUS
  Administered 2020-10-19: 10 mg via INTRAVENOUS

## 2020-10-19 MED ORDER — FENTANYL CITRATE (PF) 100 MCG/2ML IJ SOLN
INTRAMUSCULAR | Status: DC | PRN
  Administered 2020-10-19: 100 ug via INTRAVENOUS
  Administered 2020-10-19: 50 ug via INTRAVENOUS

## 2020-10-19 MED ORDER — SUGAMMADEX SODIUM 200 MG/2ML IV SOLN
INTRAVENOUS | Status: DC | PRN
  Administered 2020-10-19: 200 mg via INTRAVENOUS

## 2020-10-19 MED ORDER — ONDANSETRON HCL 4 MG/2ML IJ SOLN
INTRAMUSCULAR | Status: DC | PRN
  Administered 2020-10-19: 4 mg via INTRAVENOUS

## 2020-10-19 MED ORDER — ALBUMIN HUMAN 5 % IV SOLN: INTRAVENOUS | Status: DC | PRN

## 2020-10-19 MED ORDER — CHLORHEXIDINE GLUCONATE 0.12 % MT SOLN: 15.0000 mL | Freq: Once | OROMUCOSAL | Status: AC

## 2020-10-19 MED ORDER — KETOROLAC TROMETHAMINE 15 MG/ML IJ SOLN
INTRAMUSCULAR | Status: AC
  Administered 2020-10-19: 15 mg
  Filled 2020-10-19: qty 1

## 2020-10-19 MED ORDER — DIPHENHYDRAMINE HCL 50 MG/ML IJ SOLN: 12.5000 mg | Freq: Four times a day (QID) | INTRAMUSCULAR | Status: DC | PRN

## 2020-10-19 MED ORDER — MIDAZOLAM HCL 2 MG/2ML IJ SOLN
INTRAMUSCULAR | Status: DC | PRN
  Administered 2020-10-19: 2 mg via INTRAVENOUS

## 2020-10-19 MED ORDER — ACETAMINOPHEN 500 MG PO TABS
1000.0000 mg | ORAL_TABLET | Freq: Once | ORAL | Status: AC
  Administered 2020-10-19: 1000 mg via ORAL
  Filled 2020-10-19: qty 2

## 2020-10-19 MED ORDER — FENTANYL CITRATE (PF) 250 MCG/5ML IJ SOLN
INTRAMUSCULAR | Status: AC
  Filled 2020-10-19: qty 5

## 2020-10-19 MED ORDER — ORAL CARE MOUTH RINSE
15.0000 mL | Freq: Once | OROMUCOSAL | Status: AC
  Administered 2020-10-19: 15 mL via OROMUCOSAL

## 2020-10-19 MED ORDER — HYDROMORPHONE HCL 1 MG/ML IJ SOLN
INTRAMUSCULAR | Status: AC
  Filled 2020-10-19: qty 1

## 2020-10-19 MED ORDER — LIDOCAINE 2% (20 MG/ML) 5 ML SYRINGE
INTRAMUSCULAR | Status: DC | PRN
  Administered 2020-10-19: 80 mg via INTRAVENOUS

## 2020-10-19 MED ORDER — PROPOFOL 10 MG/ML IV BOLUS
INTRAVENOUS | Status: DC | PRN
  Administered 2020-10-19: 50 mg via INTRAVENOUS
  Administered 2020-10-19: 150 mg via INTRAVENOUS

## 2020-10-19 MED ORDER — LACTATED RINGERS IV SOLN: INTRAVENOUS | Status: DC

## 2020-10-19 MED ORDER — MAGNESIUM CITRATE PO SOLN: 1.0000 | Freq: Once | ORAL | Status: DC

## 2020-10-19 MED ORDER — MIDAZOLAM HCL 2 MG/2ML IJ SOLN
INTRAMUSCULAR | Status: AC
  Filled 2020-10-19: qty 2

## 2020-10-19 MED ORDER — CEFAZOLIN SODIUM-DEXTROSE 1-4 GM/50ML-% IV SOLN
1.0000 g | Freq: Three times a day (TID) | INTRAVENOUS | Status: AC
  Administered 2020-10-19 (×2): 1 g via INTRAVENOUS
  Filled 2020-10-19 (×2): qty 50

## 2020-10-19 MED ORDER — MORPHINE SULFATE (PF) 2 MG/ML IV SOLN: 2.0000 mg | INTRAVENOUS | Status: DC | PRN

## 2020-10-19 MED ORDER — SULFAMETHOXAZOLE-TRIMETHOPRIM 800-160 MG PO TABS: 1.0000 | ORAL_TABLET | Freq: Two times a day (BID) | ORAL | 0 refills | Status: DC

## 2020-10-19 MED ORDER — SODIUM CHLORIDE 0.9 % IV BOLUS
1000.0000 mL | Freq: Once | INTRAVENOUS | Status: AC
  Administered 2020-10-19: 1000 mL via INTRAVENOUS

## 2020-10-19 MED ORDER — BUPIVACAINE-EPINEPHRINE (PF) 0.25% -1:200000 IJ SOLN
INTRAMUSCULAR | Status: AC
  Filled 2020-10-19: qty 30

## 2020-10-19 MED ORDER — ACETAMINOPHEN 325 MG PO TABS: 650.0000 mg | ORAL_TABLET | ORAL | Status: DC | PRN

## 2020-10-19 MED ORDER — BELLADONNA ALKALOIDS-OPIUM 16.2-60 MG RE SUPP: 1.0000 | Freq: Four times a day (QID) | RECTAL | Status: DC | PRN

## 2020-10-19 MED ORDER — FENTANYL CITRATE (PF) 100 MCG/2ML IJ SOLN
25.0000 ug | INTRAMUSCULAR | Status: DC | PRN
Start: 2020-10-19 — End: 2020-10-19
  Administered 2020-10-19 (×2): 50 ug via INTRAVENOUS

## 2020-10-19 MED ORDER — FLEET ENEMA 7-19 GM/118ML RE ENEM: 1.0000 | ENEMA | Freq: Once | RECTAL | Status: DC

## 2020-10-19 MED ORDER — STERILE WATER FOR IRRIGATION IR SOLN
Status: DC | PRN
  Administered 2020-10-19: 1000 mL

## 2020-10-19 MED ORDER — ZOLPIDEM TARTRATE 5 MG PO TABS: 5.0000 mg | ORAL_TABLET | Freq: Every evening | ORAL | Status: DC | PRN

## 2020-10-19 MED ORDER — ONDANSETRON HCL 4 MG/2ML IJ SOLN: 4.0000 mg | INTRAMUSCULAR | Status: DC | PRN

## 2020-10-19 MED ORDER — KETOROLAC TROMETHAMINE 15 MG/ML IJ SOLN
15.0000 mg | Freq: Four times a day (QID) | INTRAMUSCULAR | Status: DC
  Administered 2020-10-19 – 2020-10-20 (×4): 15 mg via INTRAVENOUS
  Filled 2020-10-19 (×3): qty 1

## 2020-10-19 MED ORDER — ALBUMIN HUMAN 5 % IV SOLN
INTRAVENOUS | Status: AC
  Filled 2020-10-19: qty 250

## 2020-10-19 MED ORDER — AMISULPRIDE (ANTIEMETIC) 5 MG/2ML IV SOLN: 10.0000 mg | Freq: Once | INTRAVENOUS | Status: DC | PRN

## 2020-10-19 MED ORDER — EPHEDRINE SULFATE-NACL 50-0.9 MG/10ML-% IV SOSY
PREFILLED_SYRINGE | INTRAVENOUS | Status: DC | PRN
  Administered 2020-10-19: 5 mg via INTRAVENOUS

## 2020-10-19 MED ORDER — CEFAZOLIN SODIUM-DEXTROSE 2-4 GM/100ML-% IV SOLN
2.0000 g | Freq: Once | INTRAVENOUS | Status: AC
  Administered 2020-10-19: 2 g via INTRAVENOUS
  Filled 2020-10-19: qty 100

## 2020-10-19 SURGICAL SUPPLY — 59 items
APPLICATOR COTTON TIP 6 STRL (MISCELLANEOUS) ×2 IMPLANT
APPLICATOR COTTON TIP 6IN STRL (MISCELLANEOUS) ×3
CATH FOLEY 2WAY SLVR 18FR 30CC (CATHETERS) ×3 IMPLANT
CATH ROBINSON RED A/P 16FR (CATHETERS) ×3 IMPLANT
CATH ROBINSON RED A/P 8FR (CATHETERS) ×3 IMPLANT
CATH TIEMANN FOLEY 18FR 5CC (CATHETERS) ×3 IMPLANT
CHLORAPREP W/TINT 26 (MISCELLANEOUS) ×3 IMPLANT
CLIP VESOLOCK LG 6/CT PURPLE (CLIP) ×6 IMPLANT
COVER SURGICAL LIGHT HANDLE (MISCELLANEOUS) ×3 IMPLANT
COVER TIP SHEARS 8 DVNC (MISCELLANEOUS) ×2 IMPLANT
COVER TIP SHEARS 8MM DA VINCI (MISCELLANEOUS) ×3
COVER WAND RF STERILE (DRAPES) ×3 IMPLANT
CUTTER ECHEON FLEX ENDO 45 340 (ENDOMECHANICALS) ×3 IMPLANT
DECANTER SPIKE VIAL GLASS SM (MISCELLANEOUS) ×3 IMPLANT
DERMABOND ADVANCED (GAUZE/BANDAGES/DRESSINGS) ×1
DERMABOND ADVANCED .7 DNX12 (GAUZE/BANDAGES/DRESSINGS) ×2 IMPLANT
DRAIN CHANNEL RND F F (WOUND CARE) IMPLANT
DRAPE ARM DVNC X/XI (DISPOSABLE) ×8 IMPLANT
DRAPE COLUMN DVNC XI (DISPOSABLE) ×2 IMPLANT
DRAPE DA VINCI XI ARM (DISPOSABLE) ×12
DRAPE DA VINCI XI COLUMN (DISPOSABLE) ×3
DRAPE SURG IRRIG POUCH 19X23 (DRAPES) ×3 IMPLANT
DRSG TEGADERM 4X4.75 (GAUZE/BANDAGES/DRESSINGS) ×3 IMPLANT
ELECT PENCIL ROCKER SW 15FT (MISCELLANEOUS) ×3 IMPLANT
ELECT REM PT RETURN 15FT ADLT (MISCELLANEOUS) ×3 IMPLANT
GLOVE SURG ENC MOIS LTX SZ6.5 (GLOVE) ×3 IMPLANT
GLOVE SURG ENC TEXT LTX SZ7.5 (GLOVE) ×6 IMPLANT
GOWN STRL REUS W/TWL LRG LVL3 (GOWN DISPOSABLE) ×9 IMPLANT
HOLDER FOLEY CATH W/STRAP (MISCELLANEOUS) ×3 IMPLANT
IRRIG SUCT STRYKERFLOW 2 WTIP (MISCELLANEOUS) ×3
IRRIGATION SUCT STRKRFLW 2 WTP (MISCELLANEOUS) ×2 IMPLANT
IV LACTATED RINGERS 1000ML (IV SOLUTION) ×3 IMPLANT
KIT TURNOVER KIT A (KITS) ×3 IMPLANT
NDL SAFETY ECLIPSE 18X1.5 (NEEDLE) ×2 IMPLANT
NEEDLE HYPO 18GX1.5 SHARP (NEEDLE) ×3
PACK ROBOT UROLOGY CUSTOM (CUSTOM PROCEDURE TRAY) ×3 IMPLANT
PENCIL SMOKE EVACUATOR (MISCELLANEOUS) IMPLANT
SCISSORS LAP 5X45 EPIX DISP (ENDOMECHANICALS) ×3 IMPLANT
SEAL CANN UNIV 5-8 DVNC XI (MISCELLANEOUS) ×8 IMPLANT
SEAL XI 5MM-8MM UNIVERSAL (MISCELLANEOUS) ×12
SET TUBE SMOKE EVAC HIGH FLOW (TUBING) ×3 IMPLANT
SOLUTION ELECTROLUBE (MISCELLANEOUS) ×3 IMPLANT
STAPLE RELOAD 45 GRN (STAPLE) ×2 IMPLANT
STAPLE RELOAD 45MM GREEN (STAPLE) ×3
SUT ETHILON 3 0 PS 1 (SUTURE) ×3 IMPLANT
SUT MNCRL 3 0 RB1 (SUTURE) ×2 IMPLANT
SUT MNCRL 3 0 VIOLET RB1 (SUTURE) ×2 IMPLANT
SUT MNCRL AB 4-0 PS2 18 (SUTURE) ×6 IMPLANT
SUT MONOCRYL 3 0 RB1 (SUTURE) ×6
SUT VIC AB 0 CT1 27 (SUTURE) ×3
SUT VIC AB 0 CT1 27XBRD ANTBC (SUTURE) ×2 IMPLANT
SUT VIC AB 0 UR5 27 (SUTURE) ×3 IMPLANT
SUT VIC AB 2-0 SH 27 (SUTURE) ×3
SUT VIC AB 2-0 SH 27X BRD (SUTURE) ×2 IMPLANT
SUT VICRYL 0 UR6 27IN ABS (SUTURE) ×6 IMPLANT
SYR 27GX1/2 1ML LL SAFETY (SYRINGE) ×3 IMPLANT
TOWEL OR NON WOVEN STRL DISP B (DISPOSABLE) ×3 IMPLANT
TROCAR XCEL NON-BLD 5MMX100MML (ENDOMECHANICALS) IMPLANT
WATER STERILE IRR 1000ML POUR (IV SOLUTION) ×3 IMPLANT

## 2020-10-19 NOTE — Anesthesia Postprocedure Evaluation (Signed)
Anesthesia Post Note  Patient: Justin Reed  Procedure(s) Performed: XI ROBOTIC ASSISTED LAPAROSCOPIC RADICAL PROSTATECTOMY LEVEL 2 (N/A ) LYMPHADENECTOMY, PELVIC (Bilateral )     Patient location during evaluation: PACU Anesthesia Type: General Level of consciousness: sedated Pain management: pain level controlled Vital Signs Assessment: post-procedure vital signs reviewed and stable Respiratory status: spontaneous breathing and respiratory function stable Cardiovascular status: stable Postop Assessment: no apparent nausea or vomiting Anesthetic complications: no   No complications documented.  Last Vitals:  Vitals:   10/19/20 1115 10/19/20 1130  BP: 138/86 125/82  Pulse: (!) 56 (!) 51  Resp: 12 15  Temp:    SpO2: 98% 97%    Last Pain:  Vitals:   10/19/20 1145  TempSrc:   PainSc: 3                  [name]

## 2020-10-19 NOTE — Discharge Instructions (Signed)

## 2020-10-19 NOTE — Op Note (Signed)
Preoperative diagnosis: Clinically localized adenocarcinoma of the prostate (clinical stage T1c Nx Mx)  Postoperative diagnosis: Clinically localized adenocarcinoma of the prostate (clinical stage T1c Nx Mx)  Procedure:  1. Robotic assisted laparoscopic radical prostatectomy (bilateral nerve sparing) 2. Bilateral robotic assisted laparoscopic pelvic lymphadenectomy  Surgeon: Pryor Curia. M.D.  Assistant: Debbrah Alar, PA-C  An assistant was required for this surgical procedure.  The duties of the assistant included but were not limited to suctioning, passing suture, camera manipulation, retraction. This procedure would not be able to be performed without an Environmental consultant.  Anesthesia: General  Complications: None  EBL: 100 mL  IVF:  600 mL crystalloid, 250 cc colloid  Specimens: 1. Prostate and seminal vesicles 2. Right pelvic lymph nodes 3. Left pelvic lymph nodes  Disposition of specimens: Pathology  Drains: 1. 20 Fr coude catheter 2. # 19 Blake pelvic drain  Indication: Justin Reed is a 65 y.o. year old patient with clinically localized prostate cancer.  After a thorough review of the management options for treatment of prostate cancer, he elected to proceed with surgical therapy and the above procedure(s).  We have discussed the potential benefits and risks of the procedure, side effects of the proposed treatment, the likelihood of the patient achieving the goals of the procedure, and any potential problems that might occur during the procedure or recuperation. Informed consent has been obtained.  Description of procedure:  The patient was taken to the operating room and a general anesthetic was administered. He was given preoperative antibiotics, placed in the dorsal lithotomy position, and prepped and draped in the usual sterile fashion. Next a preoperative timeout was performed. A urethral catheter was placed into the bladder and a site was selected near the  umbilicus for placement of the camera port. This was placed using a standard open Hassan technique which allowed entry into the peritoneal cavity under direct vision and without difficulty. An 8 mm robotic port was placed and a pneumoperitoneum established. The camera was then used to inspect the abdomen and there was no evidence of any intra-abdominal injuries or other abnormalities. The remaining abdominal ports were then placed. 8 mm robotic ports were placed in the right lower quadrant, left lower quadrant, and far left lateral abdominal wall. A 5 mm port was placed in the right upper quadrant and a 12 mm port was placed in the right lateral abdominal wall for laparoscopic assistance. All ports were placed under direct vision without difficulty. The surgical cart was then docked.   Utilizing the cautery scissors, the bladder was reflected posteriorly allowing entry into the space of Retzius and identification of the endopelvic fascia and prostate. The periprostatic fat was then removed from the prostate allowing full exposure of the endopelvic fascia. The endopelvic fascia was then incised from the apex back to the base of the prostate bilaterally and the underlying levator muscle fibers were swept laterally off the prostate thereby isolating the dorsal venous complex. The dorsal vein was then stapled and divided with a 45 mm Flex Echelon stapler. Attention then turned to the bladder neck which was divided anteriorly thereby allowing entry into the bladder and exposure of the urethral catheter. The catheter balloon was deflated and the catheter was brought into the operative field and used to retract the prostate anteriorly. The posterior bladder neck was then examined and was divided allowing further dissection between the bladder and prostate posteriorly until the vasa deferentia and seminal vessels were identified. The vasa deferentia were isolated, divided,  and lifted anteriorly. The seminal vesicles were  dissected down to their tips with care to control the seminal vascular arterial blood supply. These structures were then lifted anteriorly and the space between Denonvillier's fascia and the anterior rectum was developed with a combination of sharp and blunt dissection. This isolated the vascular pedicles of the prostate.  The lateral prostatic fascia was then sharply incised allowing release of the neurovascular bundles bilaterally. The vascular pedicles of the prostate were then ligated with Weck clips between the prostate and neurovascular bundles and divided with sharp cold scissor dissection resulting in neurovascular bundle preservation. The neurovascular bundles were then separated off the apex of the prostate and urethra bilaterally.  The urethra was then sharply transected allowing the prostate specimen to be disarticulated. The pelvis was copiously irrigated and hemostasis was ensured. There was no evidence for rectal injury.  Attention then turned to the right pelvic sidewall. The fibrofatty tissue between the external iliac vein, confluence of the iliac vessels, hypogastric artery, and Cooper's ligament was dissected free from the pelvic sidewall with care to preserve the obturator nerve. Weck clips were used for lymphostasis and hemostasis. An identical procedure was performed on the contralateral side and the lymphatic packets were removed for permanent pathologic analysis.  Attention then turned to the urethral anastomosis. A 2-0 Vicryl slip knot was placed between Denonvillier's fascia, the posterior bladder neck, and the posterior urethra to reapproximate these structures. A double-armed 3-0 Monocryl suture was then used to perform a 360 running tension-free anastomosis between the bladder neck and urethra. A new urethral catheter was then placed into the bladder and irrigated. There were no blood clots within the bladder and the anastomosis appeared to be watertight. A #19 Blake drain was  then brought through the left lateral 8 mm port site and positioned appropriately within the pelvis. It was secured to the skin with a nylon suture. The surgical cart was then undocked. The right lateral 12 mm port site was closed at the fascial level with a 0 Vicryl suture placed laparoscopically. All remaining ports were then removed under direct vision. The prostate specimen was removed intact within the Endopouch retrieval bag via the periumbilical camera port site. This fascial opening was closed with two running 0 Vicryl sutures. 0.25% Marcaine was then injected into all port sites and all incisions were reapproximated at the skin level with 4-0 Monocryl subcuticular sutures and Dermabond. The patient appeared to tolerate the procedure well and without complications. The patient was able to be extubated and transferred to the recovery unit in satisfactory condition.   Pryor Curia MD

## 2020-10-19 NOTE — Progress Notes (Signed)
Pt has walked 4 times down the 4E hallway and back this shift with no s/s of distress noted or reported.

## 2020-10-19 NOTE — Anesthesia Procedure Notes (Signed)
Procedure Name: Intubation Date/Time: 10/19/2020 7:29 AM Performed by: Niel Hummer, CRNA Pre-anesthesia Checklist: Patient identified, Emergency Drugs available, Suction available and Patient being monitored Patient Re-evaluated:Patient Re-evaluated prior to induction Oxygen Delivery Method: Circle system utilized Preoxygenation: Pre-oxygenation with 100% oxygen Induction Type: IV induction Ventilation: Mask ventilation without difficulty Laryngoscope Size: Mac and 4 Grade View: Grade I Tube type: Oral Tube size: 7.0 mm Number of attempts: 2 Airway Equipment and Method: Stylet Placement Confirmation: ETT inserted through vocal cords under direct vision,  positive ETCO2 and breath sounds checked- equal and bilateral Secured at: 23 cm Tube secured with: Tape Dental Injury: Teeth and Oropharynx as per pre-operative assessment  Comments: DL x1 SRNA MAC 3 grade 2B view. Unsuccessful tube placement. Mask ventilation. DL x1 CRNA MAC 4 blade, grade 1 view.

## 2020-10-19 NOTE — Progress Notes (Signed)
Pt walked additional 2 times down the hall with his wife. No complaints or distress noted. Pt tolerating clear liquid diet with no issues noted this shift.

## 2020-10-19 NOTE — Progress Notes (Signed)
Patient ID: Justin Reed, male   DOB: April 25, 1956, 65 y.o.   MRN: [id number] Post-op note  Subjective: The patient is doing well.  No complaints. Denies N/V.  Still in PACU.  Objective: Vital signs in last 24 hours: Temp:  [97.5 F (36.4 C)-98.5 F (36.9 C)] 97.5 F (36.4 C) (02/14 1000) Pulse Rate:  [49-60] 55 (02/14 1300) Resp:  [11-18] 12 (02/14 1300) BP: (115-141)/(73-86) 120/76 (02/14 1300) SpO2:  [96 %-100 %] 96 % (02/14 1300) Weight:  [96.3 kg] 96.3 kg (02/14 0610)  Intake/Output from previous day: No intake/output data recorded. Intake/Output this shift: Total I/O In: 1050 [I.V.:700; IV Piggyback:350] Out: 100 [Blood:100]  Physical Exam:  General: Alert and oriented. Abdomen: Soft, Nondistended. Incisions: Clean and dry. Urine: dark red  Lab Results: Recent Labs    10/19/20 0525 10/19/20 1020  HGB 16.2 14.3  HCT 47.3 41.4    Assessment/Plan: POD#0   1) Continue to monitor  2) DVT prophy, clears, IS, amb, pain control   LOS: 0 days   Debbrah Alar 10/19/2020, 1:35 PM

## 2020-10-19 NOTE — Transfer of Care (Signed)
Immediate Anesthesia Transfer of Care Note  Patient: Justin Reed  Procedure(s) Performed: XI ROBOTIC ASSISTED LAPAROSCOPIC RADICAL PROSTATECTOMY LEVEL 2 (N/A ) LYMPHADENECTOMY, PELVIC (Bilateral )  Patient Location: PACU  Anesthesia Type:General  Level of Consciousness: awake, alert  and oriented  Airway & Oxygen Therapy: Patient Spontanous Breathing and Patient connected to face mask oxygen  Post-op Assessment: Report given to RN, Post -op Vital signs reviewed and stable and Patient moving all extremities X 4  Post vital signs: Reviewed and stable  Last Vitals:  Vitals Value Taken Time  BP 131/116 10/19/20 0957  Temp    Pulse 49 10/19/20 0958  Resp 13 10/19/20 0958  SpO2 98 % 10/19/20 0958  Vitals shown include unvalidated device data.  Last Pain:  Vitals:   10/19/20 0610  TempSrc: Oral         Complications: No complications documented.

## 2020-10-19 NOTE — Plan of Care (Signed)
  Problem: Education: Goal: Knowledge of the procedure and recovery process will improve Outcome: Progressing   Problem: Bowel/Gastric: Goal: Gastrointestinal status for postoperative course will improve Outcome: Progressing   Problem: Pain Management: Goal: General experience of comfort will improve Outcome: Progressing   Problem: Skin Integrity: Goal: Demonstration of wound healing without infection will improve Outcome: Progressing   Problem: Urinary Elimination: Goal: Ability to avoid or minimize complications of infection will improve Outcome: Progressing Goal: Ability to achieve and maintain urine output will improve Outcome: Progressing Goal: Home care management will improve Outcome: Progressing

## 2020-10-20 ENCOUNTER — Encounter (HOSPITAL_COMMUNITY): Payer: Self-pay | Admitting: Urology

## 2020-10-20 DIAGNOSIS — Z87891 Personal history of nicotine dependence: Secondary | ICD-10-CM | POA: Diagnosis not present

## 2020-10-20 DIAGNOSIS — C61 Malignant neoplasm of prostate: Secondary | ICD-10-CM | POA: Diagnosis not present

## 2020-10-20 DIAGNOSIS — Z7982 Long term (current) use of aspirin: Secondary | ICD-10-CM | POA: Diagnosis not present

## 2020-10-20 LAB — HEMOGLOBIN AND HEMATOCRIT, BLOOD
HCT: 37.3 % — ABNORMAL LOW (ref 39.0–52.0)
Hemoglobin: 12.7 g/dL — ABNORMAL LOW (ref 13.0–17.0)

## 2020-10-20 MED ORDER — BISACODYL 10 MG RE SUPP
10.0000 mg | Freq: Once | RECTAL | Status: AC
  Administered 2020-10-20: 10 mg via RECTAL
  Filled 2020-10-20: qty 1

## 2020-10-20 MED ORDER — TRAMADOL HCL 50 MG PO TABS: 50.0000 mg | ORAL_TABLET | Freq: Four times a day (QID) | ORAL | Status: DC | PRN

## 2020-10-20 NOTE — Progress Notes (Signed)
Patient ID: Justin Reed, male   DOB: 03-20-1956, 65 y.o.   MRN: [id number]  1 Day Post-Op Subjective: The patient is doing well.  No nausea or vomiting. Pain is adequately controlled.  Objective: Vital signs in last 24 hours: Temp:  [97.5 F (36.4 C)-99.3 F (37.4 C)] 98.2 F (36.8 C) (02/15 0526) Pulse Rate:  [49-66] 56 (02/15 0526) Resp:  [11-20] 20 (02/15 0526) BP: (110-138)/(66-86) 111/76 (02/15 0526) SpO2:  [91 %-100 %] 91 % (02/15 0526)  Intake/Output from previous day: 02/14 0701 - 02/15 0700 In: 3426.8 [P.O.:360; I.V.:2566.8; IV Piggyback:500] Out: 2270 [Urine:2000; Drains:170; Blood:100] Intake/Output this shift: No intake/output data recorded.  Physical Exam:  General: Alert and oriented. CV: RRR Lungs: Clear bilaterally. GI: Soft, Nondistended. Incisions: Clean, dry, and intact Urine: Clear Extremities: Nontender, no erythema, no edema.  Lab Results: Recent Labs    10/19/20 0525 10/19/20 1020 10/20/20 0513  HGB 16.2 14.3 12.7*  HCT 47.3 41.4 37.3*      Assessment/Plan: POD# 1 s/p robotic prostatectomy.  1) SL IVF 2) Ambulate, Incentive spirometry 3) Transition to oral pain medication 4) Dulcolax suppository 5) D/C pelvic drain 6) Plan for likely discharge later today   Pryor Curia. MD   LOS: 0 days   Dutch Gray 10/20/2020, 7:32 AM

## 2020-10-20 NOTE — Discharge Summary (Signed)
  Date of admission: 10/19/2020  Date of discharge: 10/20/2020  Admission diagnosis: Prostate Cancer  Discharge diagnosis: Prostate Cancer  History and Physical: For full details, please see admission history and physical. Briefly, Justin Reed is a 65 y.o. gentleman with localized prostate cancer.  After discussing management/treatment options, he elected to proceed with surgical treatment.  Hospital Course: Justin Reed was taken to the operating room on 10/19/2020 and underwent a robotic assisted laparoscopic radical prostatectomy. He tolerated this procedure well and without complications. Postoperatively, he was able to be transferred to a regular hospital room following recovery from anesthesia.  He was able to begin ambulating the night of surgery. He remained hemodynamically stable overnight.  He had excellent urine output with appropriately minimal output from his pelvic drain and his pelvic drain was removed on POD #1.  He was transitioned to oral pain medication, tolerated a clear liquid diet, and had met all discharge criteria and was able to be discharged home later on POD#1.  Laboratory values: Recent Labs    10/19/20 0525 10/19/20 1020 10/20/20 0513  HGB 16.2 14.3 12.7*  HCT 47.3 41.4 37.3*    Disposition: Home  Discharge instruction: He was instructed to be ambulatory but to refrain from heavy lifting, strenuous activity, or driving. He was instructed on urethral catheter care.  Discharge medications:   Allergies as of 10/20/2020   No Known Allergies     Medication List    STOP taking these medications   aspirin EC 81 MG tablet   cholecalciferol 25 MCG (1000 UNIT) tablet Commonly known as: VITAMIN D3     TAKE these medications   sulfamethoxazole-trimethoprim 800-160 MG tablet Commonly known as: BACTRIM DS Take 1 tablet by mouth 2 (two) times daily. Start the day prior to foley removal appointment   traMADol 50 MG tablet Commonly known as: Ultram Take 1-2  tablets (50-100 mg total) by mouth every 6 (six) hours as needed for moderate pain or severe pain.       Followup: He will followup in 1 week for catheter removal and to discuss his surgical pathology results.

## 2020-10-20 NOTE — Plan of Care (Signed)
  Problem: Education: Goal: Knowledge of the procedure and recovery process will improve Outcome: Completed/Met   Problem: Bowel/Gastric: Goal: Gastrointestinal status for postoperative course will improve Outcome: Completed/Met   Problem: Pain Management: Goal: General experience of comfort will improve Outcome: Completed/Met   Problem: Skin Integrity: Goal: Demonstration of wound healing without infection will improve Outcome: Completed/Met   Problem: Urinary Elimination: Goal: Ability to avoid or minimize complications of infection will improve Outcome: Completed/Met Goal: Ability to achieve and maintain urine output will improve Outcome: Completed/Met Goal: Home care management will improve Outcome: Completed/Met   Problem: Education: Goal: Knowledge of General Education information will improve Description: Including pain rating scale, medication(s)/side effects and non-pharmacologic comfort measures Outcome: Completed/Met   Problem: Health Behavior/Discharge Planning: Goal: Ability to manage health-related needs will improve Outcome: Completed/Met   Problem: Clinical Measurements: Goal: Ability to maintain clinical measurements within normal limits will improve Outcome: Completed/Met Goal: Will remain free from infection Outcome: Completed/Met Goal: Diagnostic test results will improve Outcome: Completed/Met Goal: Respiratory complications will improve Outcome: Completed/Met Goal: Cardiovascular complication will be avoided Outcome: Completed/Met   Problem: Activity: Goal: Risk for activity intolerance will decrease Outcome: Completed/Met   Problem: Nutrition: Goal: Adequate nutrition will be maintained Outcome: Completed/Met   Problem: Coping: Goal: Level of anxiety will decrease Outcome: Completed/Met   Problem: Elimination: Goal: Will not experience complications related to bowel motility Outcome: Completed/Met Goal: Will not experience  complications related to urinary retention Outcome: Completed/Met   Problem: Pain Managment: Goal: General experience of comfort will improve Outcome: Completed/Met   Problem: Safety: Goal: Ability to remain free from injury will improve Outcome: Completed/Met   Problem: Skin Integrity: Goal: Risk for impaired skin integrity will decrease Outcome: Completed/Met

## 2020-10-25 LAB — SURGICAL PATHOLOGY

## 2020-11-02 DIAGNOSIS — M6281 Muscle weakness (generalized): Secondary | ICD-10-CM | POA: Diagnosis not present

## 2020-11-02 DIAGNOSIS — N393 Stress incontinence (female) (male): Secondary | ICD-10-CM | POA: Diagnosis not present

## 2020-11-02 DIAGNOSIS — M62838 Other muscle spasm: Secondary | ICD-10-CM | POA: Diagnosis not present

## 2020-11-16 DIAGNOSIS — M6281 Muscle weakness (generalized): Secondary | ICD-10-CM | POA: Diagnosis not present

## 2020-11-16 DIAGNOSIS — N393 Stress incontinence (female) (male): Secondary | ICD-10-CM | POA: Diagnosis not present

## 2020-11-16 DIAGNOSIS — M62838 Other muscle spasm: Secondary | ICD-10-CM | POA: Diagnosis not present

## 2020-11-30 DIAGNOSIS — M6281 Muscle weakness (generalized): Secondary | ICD-10-CM | POA: Diagnosis not present

## 2020-11-30 DIAGNOSIS — M62838 Other muscle spasm: Secondary | ICD-10-CM | POA: Diagnosis not present

## 2020-11-30 DIAGNOSIS — N393 Stress incontinence (female) (male): Secondary | ICD-10-CM | POA: Diagnosis not present

## 2021-01-19 DIAGNOSIS — C61 Malignant neoplasm of prostate: Secondary | ICD-10-CM | POA: Diagnosis not present

## 2021-01-22 DIAGNOSIS — N5201 Erectile dysfunction due to arterial insufficiency: Secondary | ICD-10-CM | POA: Diagnosis not present

## 2021-01-22 DIAGNOSIS — C61 Malignant neoplasm of prostate: Secondary | ICD-10-CM | POA: Diagnosis not present

## 2021-01-22 DIAGNOSIS — N393 Stress incontinence (female) (male): Secondary | ICD-10-CM | POA: Diagnosis not present

## 2021-05-18 DIAGNOSIS — M7061 Trochanteric bursitis, right hip: Secondary | ICD-10-CM | POA: Diagnosis not present

## 2021-05-18 DIAGNOSIS — M25551 Pain in right hip: Secondary | ICD-10-CM | POA: Diagnosis not present

## 2021-05-21 DIAGNOSIS — R739 Hyperglycemia, unspecified: Secondary | ICD-10-CM | POA: Diagnosis not present

## 2021-05-21 DIAGNOSIS — Z0000 Encounter for general adult medical examination without abnormal findings: Secondary | ICD-10-CM | POA: Diagnosis not present

## 2021-05-21 DIAGNOSIS — N5231 Erectile dysfunction following radical prostatectomy: Secondary | ICD-10-CM | POA: Diagnosis not present

## 2021-05-21 DIAGNOSIS — Z23 Encounter for immunization: Secondary | ICD-10-CM | POA: Diagnosis not present

## 2021-05-21 DIAGNOSIS — E7800 Pure hypercholesterolemia, unspecified: Secondary | ICD-10-CM | POA: Diagnosis not present

## 2021-06-22 DIAGNOSIS — Z85828 Personal history of other malignant neoplasm of skin: Secondary | ICD-10-CM | POA: Diagnosis not present

## 2021-06-22 DIAGNOSIS — D225 Melanocytic nevi of trunk: Secondary | ICD-10-CM | POA: Diagnosis not present

## 2021-06-22 DIAGNOSIS — D2272 Melanocytic nevi of left lower limb, including hip: Secondary | ICD-10-CM | POA: Diagnosis not present

## 2021-06-22 DIAGNOSIS — L821 Other seborrheic keratosis: Secondary | ICD-10-CM | POA: Diagnosis not present

## 2021-06-22 DIAGNOSIS — L570 Actinic keratosis: Secondary | ICD-10-CM | POA: Diagnosis not present

## 2021-09-15 DIAGNOSIS — C61 Malignant neoplasm of prostate: Secondary | ICD-10-CM | POA: Diagnosis not present

## 2021-09-22 DIAGNOSIS — C61 Malignant neoplasm of prostate: Secondary | ICD-10-CM | POA: Diagnosis not present

## 2021-09-22 DIAGNOSIS — N5201 Erectile dysfunction due to arterial insufficiency: Secondary | ICD-10-CM | POA: Diagnosis not present

## 2024-01-29 ENCOUNTER — Ambulatory Visit (INDEPENDENT_AMBULATORY_CARE_PROVIDER_SITE_OTHER)

## 2024-01-29 ENCOUNTER — Other Ambulatory Visit: Payer: Self-pay

## 2024-01-29 ENCOUNTER — Ambulatory Visit (HOSPITAL_COMMUNITY): Admission: EM | Admit: 2024-01-29 | Discharge: 2024-01-29 | Disposition: A | Discharge status: Home / Self Care

## 2024-01-29 ENCOUNTER — Encounter (HOSPITAL_COMMUNITY): Payer: Self-pay | Admitting: *Deleted

## 2024-01-29 DIAGNOSIS — J069 Acute upper respiratory infection, unspecified: Secondary | ICD-10-CM | POA: Diagnosis not present

## 2024-01-29 DIAGNOSIS — R051 Acute cough: Secondary | ICD-10-CM | POA: Diagnosis not present

## 2024-01-29 LAB — POC COVID19/FLU A&B COMBO
Covid Antigen, POC: NEGATIVE
Influenza A Antigen, POC: NEGATIVE
Influenza B Antigen, POC: NEGATIVE

## 2024-01-29 MED ORDER — PROMETHAZINE-DM 6.25-15 MG/5ML PO SYRP: 5.0000 mL | ORAL_SOLUTION | Freq: Four times a day (QID) | ORAL | 0 refills | Status: AC | PRN

## 2024-01-29 MED ORDER — BENZONATATE 100 MG PO CAPS: 100.0000 mg | ORAL_CAPSULE | Freq: Three times a day (TID) | ORAL | 0 refills | Status: AC | PRN

## 2024-01-29 NOTE — ED Triage Notes (Signed)
 PT reports a recent airplane trip and developed a runny nose and cough.

## 2024-01-29 NOTE — ED Provider Notes (Signed)
 [facility]    CSN: [id number] Arrival date & time: 01/29/24  0935      History   Chief Complaint Chief Complaint  Patient presents with   Nasal Congestion   Cough    HPI ROQUE SCHILL is a 68 y.o. male.  4-5 day history of runny nose, congestion, and dry cough Cough causes chest discomfort and headache  No fever known Recently returned from trip to [place] Likely sick contacts  Took robitussin with slight relief and cough lozenges   Former smoker History of prostate cancer  Past Medical History:  Diagnosis Date   Cancer [facility])    prostate cancer    Heart murmur    hx of years ago not followed by a cardiologist     Patient Active Problem List   Diagnosis Date Noted   Prostate cancer (HCC) 10/19/2020    Past Surgical History:  Procedure Laterality Date   COLONOSCOPY     LYMPHADENECTOMY Bilateral 10/19/2020   Procedure: LYMPHADENECTOMY, PELVIC;  Surgeon: Florencio Hunting, MD;  Location: [facility];  Service: Urology;  Laterality: Bilateral;   ROBOT ASSISTED LAPAROSCOPIC RADICAL PROSTATECTOMY  10/19/2020   ROBOT ASSISTED LAPAROSCOPIC RADICAL PROSTATECTOMY N/A 10/19/2020   Procedure: XI ROBOTIC ASSISTED LAPAROSCOPIC RADICAL PROSTATECTOMY LEVEL 2;  Surgeon: Florencio Hunting, MD;  Location: [facility];  Service: Urology;  Laterality: N/A;     Home Medications    Prior to Admission medications   Medication Sig Start Date End Date Taking? Authorizing Provider  benzonatate (TESSALON) 100 MG capsule Take 1 capsule (100 mg total) by mouth 3 (three) times daily as needed for cough. 01/29/24  Yes Larna Capelle, Ivette Marks, PA-C  promethazine-dextromethorphan (PROMETHAZINE-DM) 6.25-15 MG/5ML syrup Take 5 mLs by mouth 4 (four) times daily as needed for cough. 01/29/24  Yes Tykee Heideman, Ivette Marks, PA-C  rosuvastatin (CRESTOR) 10 MG tablet Take 10 mg by mouth daily.   Yes [provider]    Family History History reviewed. No pertinent family history.  Social History Social  History   Tobacco Use   Smoking status: Former   Smokeless tobacco: Never   Tobacco comments:    early 44s   Vaping Use   Vaping status: Never Used  Substance Use Topics   Alcohol use: Not Currently   Drug use: Never     Allergies   Patient has no known allergies.   Review of Systems Review of Systems Per HPI  Physical Exam Triage Vital Signs ED Triage Vitals [01/29/24 1002]  Encounter Vitals Group     BP (!) 142/58     Systolic BP Percentile      Diastolic BP Percentile      Pulse Rate (!) 58     Resp 20     Temp 98.6 F (37 C)     Temp src      SpO2 98 %     Weight      Height      Head Circumference      Peak Flow      Pain Score      Pain Loc      Pain Education      Exclude from Growth Chart    No data found.  Updated Vital Signs BP (!) 142/58   Pulse (!) 58   Temp 98.6 F (37 C)   Resp 20   SpO2 98%    Physical Exam Vitals and nursing note reviewed.  Constitutional:      General: He is not in  acute distress.    Appearance: He is not ill-appearing.  HENT:     Nose: No congestion or rhinorrhea.     Mouth/Throat:     Mouth: Mucous membranes are moist.     Pharynx: Oropharynx is clear. No posterior oropharyngeal erythema.  Eyes:     Conjunctiva/sclera: Conjunctivae normal.  Cardiovascular:     Rate and Rhythm: Normal rate and regular rhythm.     Pulses: Normal pulses.     Heart sounds: Normal heart sounds.  Pulmonary:     Effort: Pulmonary effort is normal.     Breath sounds: Normal breath sounds.     Comments: Crackles in left upper and right lower lobes. Aggressive dry sounding cough in clinic Musculoskeletal:     Cervical back: Normal range of motion.  Lymphadenopathy:     Cervical: No cervical adenopathy.  Skin:    General: Skin is warm and dry.  Neurological:     Mental Status: He is alert and oriented to person, place, and time.     UC Treatments / Results  Labs (all labs ordered are listed, but only abnormal results are  displayed) Labs Reviewed  POC COVID19/FLU A&B COMBO    EKG  Radiology DG Chest 2 View Result Date: 01/29/2024 CLINICAL DATA:  cough x 5 days, recent international travel, crackles EXAM: CHEST - 2 VIEW COMPARISON:  None Available. FINDINGS: Cardiac silhouette is unremarkable. No pneumothorax or pleural effusion. The lungs are clear. The visualized skeletal structures are unremarkable. IMPRESSION: No acute cardiopulmonary process. Electronically Signed   By: Sydell Eva M.D.   On: 01/29/2024 11:02    Procedures Procedures (including critical care time)  Medications Ordered in UC Medications - No data to display  Initial Impression / Assessment and Plan / UC Course  I have reviewed the triage vital signs and the nursing notes.  Pertinent labs & imaging results that were available during my care of the patient were reviewed by me and considered in my medical decision making (see chart for details).  Afebrile, overall well appearing Negative rapid covid/flu Chest xray without abnormality. Images independently reviewed by me, agree with radiology interpretation. Discussed continuing symptomatic care for viral etiology.  Sent Tessalon and Promethazine DM to use at home, drowsy precautions.  Advise reasons to return to clinic or be seen in the ED.  Patient is agreeable to plan, no questions at this time  Final Clinical Impressions(s) / UC Diagnoses   Final diagnoses:  Acute cough  Viral URI with cough     Discharge Instructions      Your chest xray does not show any infection!  You likely have a respiratory virus. Hopefully you will begin to notice improvement over the next 4-5 days.  The tessalon cough pills can be taken 3x daily. The promethazine DM cough syrup can be used up to 4 times daily. If this medication makes you drowsy, take only once before bed. Continue hydration and lozenges as well   ED Prescriptions     Medication Sig Dispense Auth. Provider    benzonatate (TESSALON) 100 MG capsule Take 1 capsule (100 mg total) by mouth 3 (three) times daily as needed for cough. 30 capsule Stormi Vandevelde, PA-C   promethazine-dextromethorphan (PROMETHAZINE-DM) 6.25-15 MG/5ML syrup Take 5 mLs by mouth 4 (four) times daily as needed for cough. 240 mL Dee Paden, Ivette Marks, PA-C      PDMP not reviewed this encounter.   Creighton Doffing, PA-C 01/29/24 1133

## 2024-01-29 NOTE — Discharge Instructions (Addendum)
 Your chest xray does not show any infection!  You likely have a respiratory virus. Hopefully you will begin to notice improvement over the next 4-5 days.  The tessalon cough pills can be taken 3x daily. The promethazine DM cough syrup can be used up to 4 times daily. If this medication makes you drowsy, take only once before bed. Continue hydration and lozenges as well

## 2024-08-16 ENCOUNTER — Other Ambulatory Visit (HOSPITAL_BASED_OUTPATIENT_CLINIC_OR_DEPARTMENT_OTHER): Payer: Self-pay | Admitting: Internal Medicine

## 2024-08-16 DIAGNOSIS — E7800 Pure hypercholesterolemia, unspecified: Secondary | ICD-10-CM

## 2024-09-06 ENCOUNTER — Ambulatory Visit (HOSPITAL_BASED_OUTPATIENT_CLINIC_OR_DEPARTMENT_OTHER)
Admission: RE | Admit: 2024-09-06 | Discharge: 2024-09-06 | Disposition: A | Discharge status: Home / Self Care | Payer: Self-pay | Source: Ambulatory Visit | Attending: Internal Medicine | Admitting: Internal Medicine

## 2024-09-06 DIAGNOSIS — E7800 Pure hypercholesterolemia, unspecified: Secondary | ICD-10-CM | POA: Insufficient documentation
